# Patient Record
Sex: Male | Born: 1995 | Race: Black or African American | Hispanic: No | Smoking: Current every day smoker
Health system: Southern US, Community
[De-identification: ages and names within clinical notes are randomized; demographics above are authoritative.]

## PROBLEM LIST (undated history)

## (undated) DIAGNOSIS — F909 Attention-deficit hyperactivity disorder, unspecified type: Secondary | ICD-10-CM

## (undated) HISTORY — DX: Attention-deficit hyperactivity disorder, unspecified type: F90.9

## (undated) HISTORY — PX: OTHER SURGICAL HISTORY: SHX169

---
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
## 2016-02-24 ENCOUNTER — Emergency Department (HOSPITAL_COMMUNITY)
Admission: EM | Admit: 2016-02-24 | Discharge: 2016-02-24 | Disposition: A | Discharge status: Home / Self Care | Payer: Medicaid Other | Attending: Emergency Medicine | Admitting: Emergency Medicine

## 2016-02-24 ENCOUNTER — Emergency Department (HOSPITAL_COMMUNITY): Payer: Medicaid Other

## 2016-02-24 ENCOUNTER — Encounter (HOSPITAL_COMMUNITY): Payer: Self-pay | Admitting: Emergency Medicine

## 2016-02-24 DIAGNOSIS — L0889 Other specified local infections of the skin and subcutaneous tissue: Secondary | ICD-10-CM | POA: Diagnosis present

## 2016-02-24 DIAGNOSIS — L03113 Cellulitis of right upper limb: Secondary | ICD-10-CM | POA: Diagnosis not present

## 2016-02-24 DIAGNOSIS — F17200 Nicotine dependence, unspecified, uncomplicated: Secondary | ICD-10-CM | POA: Diagnosis not present

## 2016-02-24 MED ORDER — AMOXICILLIN-POT CLAVULANATE 875-125 MG PO TABS: 1.0000 | ORAL_TABLET | Freq: Two times a day (BID) | ORAL | 0 refills | Status: AC

## 2016-02-24 MED ORDER — SULFAMETHOXAZOLE-TRIMETHOPRIM 800-160 MG PO TABS
1.0000 | ORAL_TABLET | Freq: Once | ORAL | Status: AC
  Administered 2016-02-24: 1 via ORAL
  Filled 2016-02-24: qty 1

## 2016-02-24 MED ORDER — SULFAMETHOXAZOLE-TRIMETHOPRIM 800-160 MG PO TABS: 1.0000 | ORAL_TABLET | Freq: Two times a day (BID) | ORAL | 0 refills | Status: AC

## 2016-02-24 MED ORDER — AMOXICILLIN-POT CLAVULANATE 875-125 MG PO TABS
1.0000 | ORAL_TABLET | Freq: Once | ORAL | Status: AC
  Administered 2016-02-24: 1 via ORAL
  Filled 2016-02-24: qty 1

## 2016-02-24 MED ORDER — OXYCODONE-ACETAMINOPHEN 5-325 MG PO TABS
1.0000 | ORAL_TABLET | Freq: Once | ORAL | Status: AC
  Administered 2016-02-24: 1 via ORAL
  Filled 2016-02-24: qty 1

## 2016-02-24 NOTE — ED Triage Notes (Signed)
Pt c/o wound on R arm. Pt sts a "lump" has been there for a week or so now and that it recently "popped open" and is draining blood and white fluid.  Pt has hx of abscess under R armpit. Redness and swelling noted to area. Pt A&Ox4 and ambulatory.

## 2016-02-24 NOTE — ED Notes (Signed)
PT DISCHARGED. INSTRUCTIONS AND PRESCRIPTIONS GIVEN. AAOX4. PT IN NO APPARENT DISTRESS. THE OPPORTUNITY TO ASK QUESTIONS WAS PROVIDED. 

## 2016-02-24 NOTE — ED Provider Notes (Signed)
[facility]-EMERGENCY DEPT Provider Note   CSN: [id number] Arrival date & time: 02/24/16  1540  By signing my name below, I, Placido SouLogan Joldersma, attest that this documentation has been prepared under the direction and in the presence of Felicie Mornavid Clive Parcel, NP. Electronically Signed: Placido SouLogan Joldersma, ED Scribe. 02/24/16. 6:49 PM.   History   Chief Complaint Chief Complaint  Patient presents with  . Wound Infection    HPI HPI Comments: Kevin Ford is a 20 y.o. male who presents to the Emergency Department complaining of a mild wound to his right elbow x 1 week. Pt states he noticed a point of swelling, redness and warmth to his right elbow which came to a head. He then popped the wound with "a toothpick" with resulting purulent drainage. He reports associated pain surrounding the wound and difficulty sleeping due to the pain. His pain worsens with palpation and movement of his right elbow. No other associated symptoms at this time.   The history is provided by the patient. No language interpreter was used.    History reviewed. No pertinent past medical history.  There are no active problems to display for this patient.   History reviewed. No pertinent surgical history.   Home Medications    Prior to Admission medications   Not on File    Family History No family history on file.  Social History Social History  Substance Use Topics  . Smoking status: Current Every Day Smoker  . Smokeless tobacco: Never Used  . Alcohol use No     Allergies   Patient has no known allergies.   Review of Systems Review of Systems  Constitutional: Negative for chills and fever.  Musculoskeletal: Positive for arthralgias and joint swelling.  Skin: Positive for color change and wound.  Neurological: Negative for numbness.  Psychiatric/Behavioral: Positive for sleep disturbance.  All other systems reviewed and are negative.  Physical Exam Updated Vital Signs BP 126/72 (BP Location: Right Arm)    Pulse 86   Temp 98.6 F (37 C) (Oral)   Resp 22   Ht 6\' 3"  (1.905 m)   Wt 206 lb (93.4 kg)   SpO2 98%   BMI 25.75 kg/m   Physical Exam  Constitutional: He is oriented to person, place, and time. He appears well-developed and well-nourished.  HENT:  Head: Normocephalic and atraumatic.  Eyes: EOM are normal.  Neck: Normal range of motion.  Cardiovascular: Normal rate.   Pulmonary/Chest: Effort normal. No respiratory distress.  Abdominal: Soft.  Musculoskeletal: Normal range of motion.  Neurological: He is alert and oriented to person, place, and time.  Skin: Skin is warm and dry.  See image below  Psychiatric: He has a normal mood and affect.  Nursing note and vitals reviewed.    ED Treatments / Results  Labs (all labs ordered are listed, but only abnormal results are displayed) Labs Reviewed - No data to display  EKG  EKG Interpretation None       Radiology Dg Elbow 2 Views Right  Result Date: 02/24/2016 CLINICAL DATA:  Spontaneously ruptured posterior right elbow mass with draining blood and white fluid. EXAM: RIGHT ELBOW - 2 VIEW COMPARISON:  None. FINDINGS: AP and lateral views demonstrate posterior and ulnar subcutaneous edema. Normal appearing bones. No soft tissue gas, bone destruction or periosteal reaction. No effusion seen. IMPRESSION: Soft tissue swelling without underlying bony abnormality. Electronically Signed   By: Beckie SaltsSteven  Reid M.D.   On: 02/24/2016 19:31   Procedures Procedures  DIAGNOSTIC STUDIES: Oxygen  Saturation is 98% on RA, normal by my interpretation.    COORDINATION OF CARE: 6:47 PM Discussed next steps with pt. Pt verbalized understanding and is agreeable with the plan.    Medications Ordered in ED Medications - No data to display   Initial Impression / Assessment and Plan / ED Course  I have reviewed the triage vital signs and the nursing notes.  Pertinent labs & imaging results that were available during my care of the patient were  reviewed by me and considered in my medical decision making (see chart for details).  Clinical Course     Patient presentation consistent with cellulitis. Afebrile. No tachycardia, hypotension or other symptoms suggestive of severe infection. Area has been demarcated and pt advised to follow up for wound check in 24-36 hours, sooner for worsening systemic symptoms, new lymphangitis, or significant spread of erythema past line of demarcation. Will discharge with augmentin and bactrim. First dose given in ED. Return precautions discussed. Pt appears safe for discharge.     Final Clinical Impressions(s) / ED Diagnoses   Final diagnoses:  Cellulitis of right upper extremity    New Prescriptions New Prescriptions   AMOXICILLIN-CLAVULANATE (AUGMENTIN) 875-125 MG TABLET    Take 1 tablet by mouth 2 (two) times daily. One po bid x 7 days   SULFAMETHOXAZOLE-TRIMETHOPRIM (BACTRIM DS,SEPTRA DS) 800-160 MG TABLET    Take 1 tablet by mouth 2 (two) times daily.    I personally performed the services described in this documentation, which was scribed in my presence. The recorded information has been reviewed and is accurate.    Felicie Mornavid Elaf Clauson, NP 02/24/16 2037    Loren Raceravid Yelverton, MD 02/24/16 [phone]

## 2016-02-25 NOTE — Progress Notes (Signed)
EDCM spoke to patient at bedside.  Patient reports his pcp is no longer at [facility].  He reports his pcp is Dr. Lerry Linerwight Williams.  Patient is aware that he will need to call the DSS to change his pcp on his insurance card.  No further EDCM needs at this time.

## 2016-11-05 ENCOUNTER — Emergency Department (HOSPITAL_COMMUNITY): Payer: Medicaid Other

## 2016-11-05 ENCOUNTER — Encounter (HOSPITAL_COMMUNITY): Payer: Self-pay

## 2016-11-05 ENCOUNTER — Emergency Department (HOSPITAL_COMMUNITY)
Admission: EM | Admit: 2016-11-05 | Discharge: 2016-11-05 | Disposition: A | Discharge status: Home / Self Care | Payer: Medicaid Other | Attending: Emergency Medicine | Admitting: Emergency Medicine

## 2016-11-05 DIAGNOSIS — F17210 Nicotine dependence, cigarettes, uncomplicated: Secondary | ICD-10-CM | POA: Insufficient documentation

## 2016-11-05 DIAGNOSIS — S62317A Displaced fracture of base of fifth metacarpal bone. left hand, initial encounter for closed fracture: Secondary | ICD-10-CM | POA: Insufficient documentation

## 2016-11-05 DIAGNOSIS — Y929 Unspecified place or not applicable: Secondary | ICD-10-CM | POA: Insufficient documentation

## 2016-11-05 DIAGNOSIS — Y999 Unspecified external cause status: Secondary | ICD-10-CM | POA: Insufficient documentation

## 2016-11-05 DIAGNOSIS — Y939 Activity, unspecified: Secondary | ICD-10-CM | POA: Insufficient documentation

## 2016-11-05 DIAGNOSIS — W2209XA Striking against other stationary object, initial encounter: Secondary | ICD-10-CM | POA: Insufficient documentation

## 2016-11-05 NOTE — Progress Notes (Signed)
Orthopedic Tech Progress Note Patient Details:  Kevin Ford 08/03/1995 [id number]  Ortho Devices Type of Ortho Device: Ace wrap Ortho Device/Splint Location: Well Padded Plaster Ulnar Gutter Splint to Lt arm. Ortho Device/Splint Interventions: Application   Kevin Ford 11/05/2016, 9:18 PM

## 2016-11-05 NOTE — ED Provider Notes (Signed)
[facility]-EMERGENCY DEPT Provider Note   CSN: [id number] Arrival date & time: 11/05/16  1843     History   Chief Complaint Chief Complaint  Patient presents with  . Hand Injury    HPI Kevin Ford is a 21 y.o. male who presents to the emergency department with constant left hand pain that began suddenly one week ago after he punched a metal door at work. He reports he has noted mild, worsening swelling over the head of the hand near his pinky finger. No treatment prior to arrival. He reports that he is concerned that he broke that hand because the pain has not improved over the last week. He denies right hand pain, numbness, tingling, or weakness. He is right-hand dominant.  The history is provided by the patient. No language interpreter was used.    History reviewed. No pertinent past medical history.  There are no active problems to display for this patient.   History reviewed. No pertinent surgical history.     Home Medications    Prior to Admission medications   Medication Sig Start Date End Date Taking? Authorizing Provider  amoxicillin-clavulanate (AUGMENTIN) 875-125 MG tablet Take 1 tablet by mouth 2 (two) times daily. One po bid x 7 days 02/24/16   Felicie Morn, NP    Family History History reviewed. No pertinent family history.  Social History Social History  Substance Use Topics  . Smoking status: Current Every Day Smoker  . Smokeless tobacco: Never Used  . Alcohol use No     Allergies   Patient has no known allergies.   Review of Systems Review of Systems  Musculoskeletal: Positive for arthralgias and myalgias.  Skin: Negative for wound.  Allergic/Immunologic: Negative for immunocompromised state.  Neurological: Negative for weakness and numbness.   Physical Exam Updated Vital Signs BP 120/68 (BP Location: Left Arm)   Pulse 100   Temp 98.2 F (36.8 C) (Oral)   Resp 16   Ht 6\' 3"  (1.905 m)   Wt 77.1 kg (170 lb)   SpO2 100%   BMI 21.25 kg/m     Physical Exam  Constitutional: He appears well-developed.  HENT:  Head: Normocephalic.  Eyes: Conjunctivae are normal.  Neck: Neck supple.  Cardiovascular: Normal rate and regular rhythm.   No murmur heard. Pulmonary/Chest: Effort normal.  Abdominal: Soft. He exhibits no distension.  Musculoskeletal:  Tender to palpation over the fifth metacarpal and along the ulnar/medial aspect of the left hand. Radial pulses are 2+ bilaterally. Full range of motion of all digits of the left hand and the left wrist. Sensation is intact throughout. Good grip strength and strength against resistance.  Neurological: He is alert.  Skin: Skin is warm and dry. Capillary refill takes less than 2 seconds.  Psychiatric: His behavior is normal.  Nursing note and vitals reviewed.    ED Treatments / Results  Labs (all labs ordered are listed, but only abnormal results are displayed) Labs Reviewed - No data to display  EKG  EKG Interpretation None       Radiology Dg Hand Complete Left  Result Date: 11/05/2016 CLINICAL DATA:  Pain in left hand on ulnar side; punched a wall 1 week ago; most pain in 5th MCP joint and 5th metacarpal, and 5th CMC joint ; no previous left hand fx EXAM: LEFT HAND - COMPLETE 3+ VIEW COMPARISON:  None. FINDINGS: There is a fracture at the base of the fifth metacarpal. This is an intra-articular fracture, across the radial base, with  the radial base fracture component displaced in a radial direction by 4.5 mm. No fracture comminution. No dislocation. No other fractures.  Joints are normally spaced and aligned. There is ulnar sided soft tissue swelling. IMPRESSION: 1. Mildly displaced fracture at the base of the fifth metacarpal, which extends to the articular surface with the hamate. 2. No other fractures.  No dislocation. Electronically Signed   By: Amie Portlandavid  Ormond M.D.   On: 11/05/2016 19:40    Procedures Procedures (including critical care time)  Medications Ordered in  ED Medications - No data to display   Initial Impression / Assessment and Plan / ED Course  I have reviewed the triage vital signs and the nursing notes.  Pertinent labs & imaging results that were available during my care of the patient were reviewed by me and considered in my medical decision making (see chart for details).     Patient presenting with left hand pain after punching a metal door 1 week ago. X-ray demonstrating mildly displaced fracture at the base of the fifth metacarpal, which extends to the area to the articular surface with the hamate. The patient was placed in an ulnar gutter splint and provided with an outpatient referral to hand surgery. No acute distress. Discussed the plan with the patient who is agreeable at this time. Vital signs stable. The patient is safe and stable for discharge at this time.  Final Clinical Impressions(s) / ED Diagnoses   Final diagnoses:  Closed displaced fracture of base of fifth metacarpal bone of left hand, initial encounter    New Prescriptions Discharge Medication List as of 11/05/2016  9:24 PM       Kathrynne Kulinski, Coral ElseMia A, PA-C 11/06/16 [id number]    Benjiman CorePickering, Nathan, MD 11/07/16 0030

## 2016-11-05 NOTE — Discharge Instructions (Signed)
Please call Dr. Carlos LeveringGramig's office tomorrow morning to schedule follow-up appointment. Please wear the splint continuously until you are evaluated in his office. If he develop new or worsening symptoms including numbness or weakness and the hand or fingers, please return to the emergency department for reevaluation. Please keep the splint clean and dry.

## 2016-11-05 NOTE — ED Notes (Signed)
Orthro Tech callled

## 2016-11-05 NOTE — ED Triage Notes (Signed)
Pt states that he punched a door with left hand  approx 1 week ago , and has been having increasing pain, limited ROM and some deformity is noted to pinky knuckle. Pt states that has some mild numbness to left pinky finger

## 2019-08-20 ENCOUNTER — Encounter (HOSPITAL_COMMUNITY): Payer: Self-pay | Admitting: Emergency Medicine

## 2019-08-20 ENCOUNTER — Other Ambulatory Visit: Payer: Self-pay

## 2019-08-20 ENCOUNTER — Emergency Department (HOSPITAL_COMMUNITY): Payer: Self-pay

## 2019-08-20 ENCOUNTER — Emergency Department (HOSPITAL_COMMUNITY)
Admission: EM | Admit: 2019-08-20 | Discharge: 2019-08-21 | Disposition: A | Discharge status: Home / Self Care | Payer: Self-pay | Attending: Emergency Medicine | Admitting: Emergency Medicine

## 2019-08-20 DIAGNOSIS — R1310 Dysphagia, unspecified: Secondary | ICD-10-CM | POA: Insufficient documentation

## 2019-08-20 DIAGNOSIS — F17200 Nicotine dependence, unspecified, uncomplicated: Secondary | ICD-10-CM | POA: Insufficient documentation

## 2019-08-20 DIAGNOSIS — K122 Cellulitis and abscess of mouth: Secondary | ICD-10-CM | POA: Insufficient documentation

## 2019-08-20 MED ORDER — PREDNISONE 20 MG PO TABS
60.0000 mg | ORAL_TABLET | Freq: Once | ORAL | Status: AC
  Administered 2019-08-20: 60 mg via ORAL
  Filled 2019-08-20: qty 3

## 2019-08-20 MED ORDER — AMOXICILLIN 500 MG PO CAPS
500.0000 mg | ORAL_CAPSULE | Freq: Once | ORAL | Status: AC
  Administered 2019-08-20: 23:00:00 500 mg via ORAL
  Filled 2019-08-20: qty 1

## 2019-08-20 MED ORDER — LIDOCAINE VISCOUS HCL 2 % MT SOLN
15.0000 mL | Freq: Once | OROMUCOSAL | Status: AC
  Administered 2019-08-20: 15 mL via ORAL
  Filled 2019-08-20: qty 15

## 2019-08-20 MED ORDER — ALUM & MAG HYDROXIDE-SIMETH 200-200-20 MG/5ML PO SUSP
30.0000 mL | Freq: Once | ORAL | Status: AC
  Administered 2019-08-20: 30 mL via ORAL
  Filled 2019-08-20: qty 30

## 2019-08-20 NOTE — ED Triage Notes (Signed)
Patient is complaining of mucous build up in back of his throat that started 3 days ago.

## 2019-08-20 NOTE — ED Provider Notes (Signed)
[facility] DEPT Provider Note   CSN: [id number] Arrival date & time: 08/20/19  2220     History Chief Complaint  Patient presents with  . Mucositis    Kevin Ford is a 24 y.o. male.  Patient complains of a 2-day history of "mucous stuck in the back of my throat".  States he does have Covid vaccine 4 days ago developed this about 2 days ago.  He thinks something is blocking his esophagus though he has no difficulty swallowing and is tolerating p.o. without a problem.  He states he does feel short of breath.  He states there is mucus blocking the back of his throat is not able to cough it up.  He has tried steam and hot tea without relief.  He denies having a sore throat.  He denies any cough, fever, chest pain, abdominal pain, nausea or vomiting.  He has no pain with swallowing.  He is able to eat and drink without a problem.  He denies any injury to his throat.  He is able to swallow pills without a problem.  No vomiting.  The history is provided by the patient.       History reviewed. No pertinent past medical history.  There are no problems to display for this patient.   History reviewed. No pertinent surgical history.     History reviewed. No pertinent family history.  Social History   Tobacco Use  . Smoking status: Current Every Day Smoker  . Smokeless tobacco: Never Used  Substance Use Topics  . Alcohol use: No  . Drug use: Yes    Types: Marijuana    Home Medications Prior to Admission medications   Medication Sig Start Date End Date Taking? Authorizing Provider  amoxicillin-clavulanate (AUGMENTIN) 875-125 MG tablet Take 1 tablet by mouth 2 (two) times daily. One po bid x 7 days 02/24/16   Etta Quill, NP    Allergies    Patient has no known allergies.  Review of Systems   Review of Systems  Constitutional: Negative for activity change, appetite change and fever.  HENT: Positive for congestion, postnasal drip and sore  throat. Negative for facial swelling, mouth sores and trouble swallowing.   Respiratory: Negative for cough and shortness of breath.   Cardiovascular: Negative for chest pain and palpitations.  Gastrointestinal: Negative for abdominal pain, nausea and vomiting.  Genitourinary: Negative for dysuria, hematuria, testicular pain and urgency.  Musculoskeletal: Negative for arthralgias and myalgias.  Skin: Negative for wound.  Neurological: Negative for weakness and headaches.   all other systems are negative except as noted in the HPI and PMH.    Physical Exam Updated Vital Signs BP 115/67 (BP Location: Left Arm)   Pulse 68   Temp 98.3 F (36.8 C) (Oral)   Resp 16   Ht 6\' 3"  (1.905 m)   Wt 87.5 kg   SpO2 99%   BMI 24.12 kg/m   Physical Exam Vitals and nursing note reviewed.  Constitutional:      General: He is not in acute distress.    Appearance: He is well-developed.     Comments: No distress, speaking full sentences  HENT:     Head: Normocephalic and atraumatic.     Right Ear: Tympanic membrane normal.     Left Ear: Tympanic membrane normal.     Nose: No congestion or rhinorrhea.     Mouth/Throat:     Pharynx: No oropharyngeal exudate.     Comments: Swelling secretions,  no malocclusion or trismus.  Floor mouth is soft.  No tongue or lip swelling.  Uvula is midline but elongated and mildly erythematous.  No asymmetry of the palate. Eyes:     Conjunctiva/sclera: Conjunctivae normal.     Pupils: Pupils are equal, round, and reactive to light.  Neck:     Comments: No meningismus. Cardiovascular:     Rate and Rhythm: Normal rate and regular rhythm.     Heart sounds: Normal heart sounds. No murmur.  Pulmonary:     Effort: Pulmonary effort is normal. No respiratory distress.     Breath sounds: Normal breath sounds.  Chest:     Chest wall: No tenderness.  Abdominal:     Palpations: Abdomen is soft.     Tenderness: There is no abdominal tenderness. There is no guarding or  rebound.  Musculoskeletal:        General: No tenderness. Normal range of motion.     Cervical back: Normal range of motion and neck supple. No rigidity or tenderness.  Lymphadenopathy:     Cervical: No cervical adenopathy.  Skin:    General: Skin is warm.     Capillary Refill: Capillary refill takes less than 2 seconds.  Neurological:     General: No focal deficit present.     Mental Status: He is alert and oriented to person, place, and time. Mental status is at baseline.     Cranial Nerves: No cranial nerve deficit.     Motor: No abnormal muscle tone.     Coordination: Coordination normal.     Comments: No ataxia on finger to nose bilaterally. No pronator drift. 5/5 strength throughout. CN 2-12 intact.Equal grip strength. Sensation intact.   Psychiatric:        Behavior: Behavior normal.     ED Results / Procedures / Treatments   Labs (all labs ordered are listed, but only abnormal results are displayed) Labs Reviewed  GROUP A STREP BY PCR    EKG None  Radiology DG Neck Soft Tissue  Result Date: 08/20/2019 CLINICAL DATA:  Painful swallowing EXAM: NECK SOFT TISSUES - 1+ VIEW COMPARISON:  None. FINDINGS: There is no evidence of retropharyngeal soft tissue swelling or epiglottic enlargement. The cervical airway is unremarkable and no radio-opaque foreign body identified. IMPRESSION: Negative. Electronically Signed   By: Deatra Robinson M.D.   On: 08/20/2019 23:41   DG Chest 2 View  Result Date: 08/20/2019 CLINICAL DATA:  Difficulty swallowing.  Excessive pharyngeal mucus EXAM: CHEST - 2 VIEW COMPARISON:  None. FINDINGS: The heart size and mediastinal contours are within normal limits. Both lungs are clear. The visualized skeletal structures are unremarkable. IMPRESSION: No active cardiopulmonary disease. Electronically Signed   By: Deatra Robinson M.D.   On: 08/20/2019 23:31    Procedures Procedures (including critical care time)  Medications Ordered in ED Medications  alum &  mag hydroxide-simeth (MAALOX/MYLANTA) 200-200-20 MG/5ML suspension 30 mL (has no administration in time range)    And  lidocaine (XYLOCAINE) 2 % viscous mouth solution 15 mL (has no administration in time range)  predniSONE (DELTASONE) tablet 60 mg (has no administration in time range)  amoxicillin (AMOXIL) capsule 500 mg (has no administration in time range)    ED Course  I have reviewed the triage vital signs and the nursing notes.  Pertinent labs & imaging results that were available during my care of the patient were reviewed by me and considered in my medical decision making (see chart for details).  MDM Rules/Calculators/A&P                     Patient states he has a blockage in the back of his throat for mucus.  He is able to eat and drink without a problem.  There is no difficulty breathing or difficulty swallowing.  Exam shows elongated uvula which patient viewed in the mirror and states is abnormal.  It does appear mildly erythematous and is likely the sensation he is feeling.  He has no evidence of a food impaction.  There is no evidence of a peritonsillar abscess.  Low suspicion for epiglottitis or peritonsillar abscess or retropharyngeal abscess.  X-rays negative for foreign body or retropharyngeal abscess.  Discussed appears normal.  Patient tolerating p.o. and feels improved after prednisone and GI cocktail.  We will treat supportively for suspected uvulitis.  Follow-up with PCP.  Return to the ED with difficulty breathing, difficulty swallowing, any other concerns Final Clinical Impression(s) / ED Diagnoses Final diagnoses:  Uvulitis  Odynophagia    Rx / DC Orders ED Discharge Orders    None       Alizee Maple, Jeannett Senior, MD 08/21/19 [phone]

## 2019-08-21 ENCOUNTER — Encounter (INDEPENDENT_AMBULATORY_CARE_PROVIDER_SITE_OTHER): Payer: Self-pay

## 2019-08-21 LAB — GROUP A STREP BY PCR: Group A Strep by PCR: NOT DETECTED

## 2019-08-21 MED ORDER — AMOXICILLIN 500 MG PO CAPS: 500.0000 mg | ORAL_CAPSULE | Freq: Three times a day (TID) | ORAL | 0 refills | Status: AC

## 2019-08-21 MED ORDER — PREDNISONE 50 MG PO TABS: ORAL_TABLET | ORAL | 0 refills | Status: AC

## 2019-08-21 NOTE — Discharge Instructions (Signed)
Take the antibiotics and steroids as prescribed.  Follow-up with your primary doctor.  Return to the ED with difficulty breathing, difficulty swallowing or other concerns

## 2019-08-23 ENCOUNTER — Ambulatory Visit: Payer: Self-pay | Admitting: *Deleted

## 2019-08-23 NOTE — Telephone Encounter (Signed)
Pt states took 2 Acia supplements with prednisone just prescribed. Advised to stop Acai until prednisone course completed as may interfere with absorption. Pt verbalizes understanding.   Reason for Disposition . Caller has medication question only, adult not sick, and triager answers question  Answer Assessment - Initial Assessment Questions 1.   NAME of MEDICATION: "What medicine are you calling about?"     Acai supplement with prednisone 2.   QUESTION: "What is your question?"     CAn I take together?  Protocols used: MEDICATION QUESTION CALL-A-AH

## 2019-08-25 ENCOUNTER — Other Ambulatory Visit: Payer: Self-pay

## 2019-08-25 ENCOUNTER — Encounter: Payer: Self-pay | Admitting: Family Medicine

## 2019-08-25 ENCOUNTER — Ambulatory Visit: Payer: Self-pay | Attending: Family Medicine | Admitting: Family Medicine

## 2019-08-25 VITALS — BP 120/77 | HR 81 | Temp 98.8°F | Ht 75.0 in | Wt 180.6 lb

## 2019-08-25 DIAGNOSIS — E010 Iodine-deficiency related diffuse (endemic) goiter: Secondary | ICD-10-CM

## 2019-08-25 DIAGNOSIS — R634 Abnormal weight loss: Secondary | ICD-10-CM

## 2019-08-25 DIAGNOSIS — J00 Acute nasopharyngitis [common cold]: Secondary | ICD-10-CM

## 2019-08-25 DIAGNOSIS — Z09 Encounter for follow-up examination after completed treatment for conditions other than malignant neoplasm: Secondary | ICD-10-CM

## 2019-08-25 DIAGNOSIS — R0789 Other chest pain: Secondary | ICD-10-CM

## 2019-08-25 MED ORDER — FAMOTIDINE 20 MG PO TABS: 20.0000 mg | ORAL_TABLET | Freq: Two times a day (BID) | ORAL | 0 refills | Status: DC

## 2019-08-25 MED ORDER — CETIRIZINE HCL 10 MG PO TABS: 10.0000 mg | ORAL_TABLET | Freq: Every day | ORAL | 2 refills | Status: AC

## 2019-08-25 NOTE — Patient Instructions (Signed)

## 2019-08-25 NOTE — Progress Notes (Signed)
HFU  Possible anxiety (GAD-7 is high today)  Problems eating   Chest pains only at night

## 2019-08-25 NOTE — Progress Notes (Signed)
Subjective:  Patient ID: Kevin Ford, male    DOB: [birth year]  Age: 24 y.o. MRN: [id number]  CC: ED follow-up  HPI Kevin Ford, 24 yo male, new to the practice, who is status post ED visit on 08/20/2019 with complaint of feeling as if he had a lot of mucous in his throat and he was diagnosed with Uvulitis.  He reports that he is not believe that the prescribed antibiotics and prednisone helped with the abnormal throat sensation that he was experiencing.  Patient feels as if he is having postnasal drainage.  He also feels as if after starting the prednisone, he developed some left-sided chest pressure/discomfort as if there were bubbles in his chest at times.  He denies any fever or chills.  He has had some occasional pressure in the forehead area.  He has had some nasal congestion and runny nose.  Nasal drainage has been clear.  He also tends to have a cough at night after lying down.  Cough is nonproductive.  He denies sore throat but has some throat irritation from the postnasal drainage.  He denies any difficulty swallowing.  He has a sensation that there is mucus in the back of his throat.  He has noticed some enlargement in the mid to lower part of his neck and wonders if he may have an issue with his thyroid.  He has felt that he has been losing weight without trying in addition to sometimes feeling warmer than other people and occasional sensation of increased heart rate.  Past Medical History:  Diagnosis Date  . ADHD (attention deficit hyperactivity disorder)     Past Surgical History:  Procedure Laterality Date  . none      Family History  Problem Relation Age of Onset  . HIV Mother   . Diabetes Father   . Hypertension Father     Social History   Tobacco Use  . Smoking status: Current Every Day Smoker  . Smokeless tobacco: Never Used  Substance Use Topics  . Alcohol use: No    ROS Review of Systems  Constitutional: Positive for fatigue (mild). Negative for chills  and fever.  HENT: Positive for postnasal drip, rhinorrhea and sinus pressure (forehead). Negative for ear pain (some ear pressure), nosebleeds, sore throat (throat irritation from drainage) and trouble swallowing.   Respiratory: Positive for cough (at night when lying down). Negative for shortness of breath.   Cardiovascular: Negative for chest pain, palpitations and leg swelling.  Gastrointestinal: Negative for abdominal pain, constipation, diarrhea and nausea.  Endocrine: Negative for cold intolerance, heat intolerance, polydipsia, polyphagia and polyuria.  Genitourinary: Negative for dysuria and frequency.  Musculoskeletal: Negative for arthralgias and back pain.  Skin: Negative for rash and wound.  Neurological: Negative for dizziness and headaches.  Hematological: Negative for adenopathy. Does not bruise/bleed easily.    Objective:   Today's Vitals: BP 120/77   Pulse 81   Temp 98.8 F (37.1 C) (Temporal)   Ht 6\' 3"  (1.905 m)   Wt 180 lb 9.6 oz (81.9 kg)   SpO2 96%   BMI 22.57 kg/m   Physical Exam Vitals reviewed.  Constitutional:      Appearance: Normal appearance.     Comments: WNWD young adult male in NAD wearing a mask as per office COVID protocol. Patient with a slight nasal quality to his voice.  HENT:     Right Ear: Hearing, ear canal and external ear normal. Tympanic membrane is erythematous.  Left Ear: Hearing, ear canal and external ear normal. Tympanic membrane is erythematous.     Ears:     Comments: TMs are dark pink bilaterally, thickened and without visible landmarks    Nose: Mucosal edema, congestion and rhinorrhea present.     Right Turbinates: Not enlarged or swollen.     Left Turbinates: Not enlarged or swollen.     Right Sinus: No maxillary sinus tenderness or frontal sinus tenderness.     Left Sinus: No maxillary sinus tenderness or frontal sinus tenderness.     Mouth/Throat:     Pharynx: Posterior oropharyngeal erythema present. No oropharyngeal  exudate or uvula swelling.     Comments: Mild to moderate edema/erythema and cobblestoning of the posterior pharynx.  Uvula is midline and does not appear swollen but is slightly erythematous.  Patient does have long uvula. Neck:     Comments: Generalized enlargement of the thyroid.  Mild anterior cervical chain lymphadenopathy Cardiovascular:     Rate and Rhythm: Normal rate and regular rhythm.  Pulmonary:     Effort: Pulmonary effort is normal.     Breath sounds: Normal breath sounds. No rhonchi.  Abdominal:     Palpations: Abdomen is soft.     Tenderness: There is abdominal tenderness (With palpation in the epigastric area, patient has sensation of air bubbles in chest similar to recent recurrent chest pain). There is no right CVA tenderness, left CVA tenderness, guarding or rebound.  Musculoskeletal:        General: No tenderness.     Cervical back: Normal range of motion and neck supple. No rigidity or tenderness.     Right lower leg: No edema.     Left lower leg: No edema.  Lymphadenopathy:     Cervical: Cervical adenopathy present.  Skin:    General: Skin is warm and dry.     Findings: No rash.  Neurological:     General: No focal deficit present.     Mental Status: He is alert and oriented to person, place, and time.  Psychiatric:        Mood and Affect: Mood normal.     Assessment & Plan:  1. Thyromegaly; 2.  Abnormal weight loss Patient presents with thyromegaly on examination and he reports that he feels as if he has had some recent abnormal weight loss without trying as well as occasional sensation of increased heart rate and feeling warmer than others on occasion.  Will check thyroid panel with TSH to look for hyperthyroidism and patient will be scheduled for thyroid ultrasound and follow-up with thyromegaly.  We will also check CBC and basic metabolic panel in follow-up of patient's complaint of possible abnormal weight loss - Thyroid Panel With TSH - US THYROID;  Future - CBC with Differential - Basic Metabolic Panel  3. Rhinopharyngitis; encounter for examination following treatment at hospital Notes and labs from patient's recent emergency department visit on 08/20/2019 were reviewed and discussed with the patient at today's visit.  Patient appears to have rhinopharyngitis as a cause of his abnormal throat sensation as he does have evidence of postnasal drainage on examination.  He also appears to have otitis media but has recently been on Augmentin after his ED visit and denies current ear pain.  He does have some ear pressure in addition to his postnasal drainage.  He will be placed on Zyrtec 10 mg at bedtime to help with postnasal drainage and he is aware that the medication can cause drowsiness. - cetirizine (  ZYRTEC) 10 MG tablet; Take 1 tablet (10 mg total) by mouth daily. At bedtime as needed for congestion  Dispense: 30 tablet; Refill: 2  4. Atypical chest pain Patient with onset of atypical chest pain after use of prednisone.  Prednisone use can cause stomach upset/gastritis and patient with complaint of reproducibility of atypical chest pain sensation with palpation of the epigastric area at today's visit.  He will be placed on Pepcid 20 mg twice daily and should avoid use of nonsteroidal anti-inflammatories and spicy/greasy foods.  He should return in the next 1 to 2 weeks if he has continued issues with atypical chest pain.  He was made aware that he should go to emergency department if he has any concerns regarding chest pain, throat discomfort or any other concerns. - famotidine (PEPCID) 20 MG tablet; Take 1 tablet (20 mg total) by mouth 2 (two) times daily. To reduce stomach acid  Dispense: 60 tablet; Refill: 0.   Outpatient Encounter Medications as of 08/25/2019  Medication Sig  . naproxen (NAPROSYN) 375 MG tablet Take 375 mg by mouth daily as needed for moderate pain.  Marland Kitchen amoxicillin (AMOXIL) 500 MG capsule Take 1 capsule (500 mg total) by mouth 3  (three) times daily. (Patient not taking: Reported on 08/25/2019)  . amoxicillin-clavulanate (AUGMENTIN) 875-125 MG tablet Take 1 tablet by mouth 2 (two) times daily. One po bid x 7 days (Patient not taking: Reported on 08/20/2019)  . cetirizine (ZYRTEC) 10 MG tablet Take 1 tablet (10 mg total) by mouth daily. At bedtime as needed for congestion  . famotidine (PEPCID) 20 MG tablet Take 1 tablet (20 mg total) by mouth 2 (two) times daily. To reduce stomach acid  . predniSONE (DELTASONE) 50 MG tablet 1 tablet PO daily (Patient not taking: Reported on 08/25/2019)   No facility-administered encounter medications on file as of 08/25/2019.    An After Visit Summary was printed and given to the patient.   Follow-up: Return for one or two weeks if not feeling better and ED if worse.    Antony Blackbird MD

## 2019-08-26 LAB — BASIC METABOLIC PANEL WITH GFR
BUN/Creatinine Ratio: 17 (ref 9–20)
BUN: 15 mg/dL (ref 6–20)
CO2: 25 mmol/L (ref 20–29)
Calcium: 9.9 mg/dL (ref 8.7–10.2)
Chloride: 101 mmol/L (ref 96–106)
Creatinine, Ser: 0.88 mg/dL (ref 0.76–1.27)
GFR calc Af Amer: 139 mL/min/1.73
GFR calc non Af Amer: 120 mL/min/1.73
Glucose: 86 mg/dL (ref 65–99)
Potassium: 4.1 mmol/L (ref 3.5–5.2)
Sodium: 142 mmol/L (ref 134–144)

## 2019-08-26 LAB — CBC WITH DIFFERENTIAL/PLATELET
Basophils Absolute: 0.1 x10E3/uL (ref 0.0–0.2)
Basos: 1 %
EOS (ABSOLUTE): 0 x10E3/uL (ref 0.0–0.4)
Eos: 1 %
Hematocrit: 45.1 % (ref 37.5–51.0)
Hemoglobin: 15.5 g/dL (ref 13.0–17.7)
Immature Grans (Abs): 0 x10E3/uL (ref 0.0–0.1)
Immature Granulocytes: 0 %
Lymphocytes Absolute: 3.7 x10E3/uL — ABNORMAL HIGH (ref 0.7–3.1)
Lymphs: 42 %
MCH: 31.7 pg (ref 26.6–33.0)
MCHC: 34.4 g/dL (ref 31.5–35.7)
MCV: 92 fL (ref 79–97)
Monocytes Absolute: 0.7 x10E3/uL (ref 0.1–0.9)
Monocytes: 8 %
Neutrophils Absolute: 4.2 x10E3/uL (ref 1.4–7.0)
Neutrophils: 48 %
Platelets: 275 x10E3/uL (ref 150–450)
RBC: 4.89 x10E6/uL (ref 4.14–5.80)
RDW: 12.3 % (ref 11.6–15.4)
WBC: 8.7 x10E3/uL (ref 3.4–10.8)

## 2019-08-26 LAB — THYROID PANEL WITH TSH
Free Thyroxine Index: 2.6 (ref 1.2–4.9)
T3 Uptake Ratio: 29 % (ref 24–39)
T4, Total: 9.1 ug/dL (ref 4.5–12.0)
TSH: 1.6 u[IU]/mL (ref 0.450–4.500)

## 2019-08-28 ENCOUNTER — Ambulatory Visit: Payer: Self-pay | Admitting: Pharmacist

## 2019-08-30 ENCOUNTER — Ambulatory Visit (HOSPITAL_COMMUNITY): Admission: RE | Admit: 2019-08-30 | Payer: Self-pay | Source: Ambulatory Visit

## 2019-08-30 ENCOUNTER — Encounter (HOSPITAL_COMMUNITY): Payer: Self-pay

## 2019-09-17 ENCOUNTER — Other Ambulatory Visit: Payer: Self-pay | Admitting: Family Medicine

## 2019-09-17 DIAGNOSIS — R0789 Other chest pain: Secondary | ICD-10-CM

## 2020-02-02 ENCOUNTER — Other Ambulatory Visit: Payer: Self-pay | Admitting: Family Medicine

## 2020-02-02 DIAGNOSIS — R0789 Other chest pain: Secondary | ICD-10-CM

## 2021-01-29 ENCOUNTER — Ambulatory Visit: Payer: Self-pay | Admitting: Family Medicine

## 2022-03-14 IMAGING — CR DG NECK SOFT TISSUE
2 series · 2 of 2 positions shown · non-contrast
Comparison: None.

CLINICAL DATA: Painful swallowing

EXAM:
NECK SOFT TISSUES - 1+ VIEW

[w soft tissue neck lat (1 of 2)]
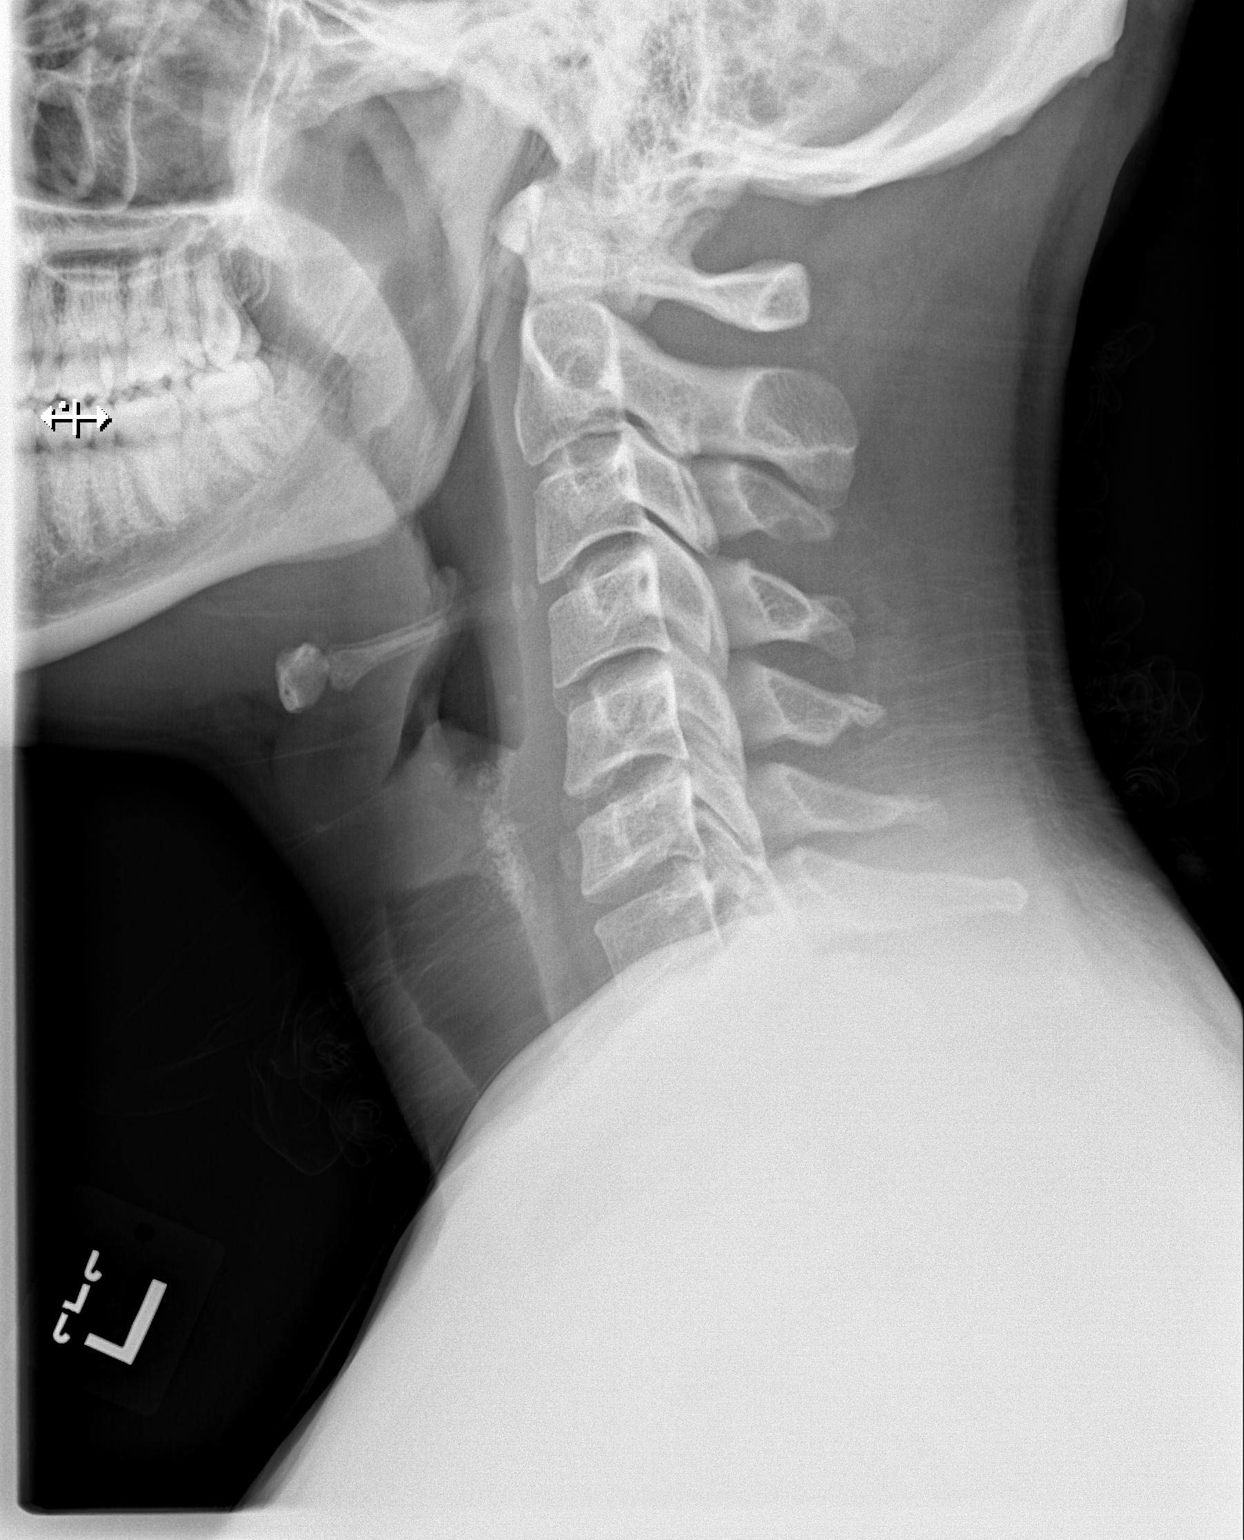

[w soft tissue neck lat (2 of 2)]
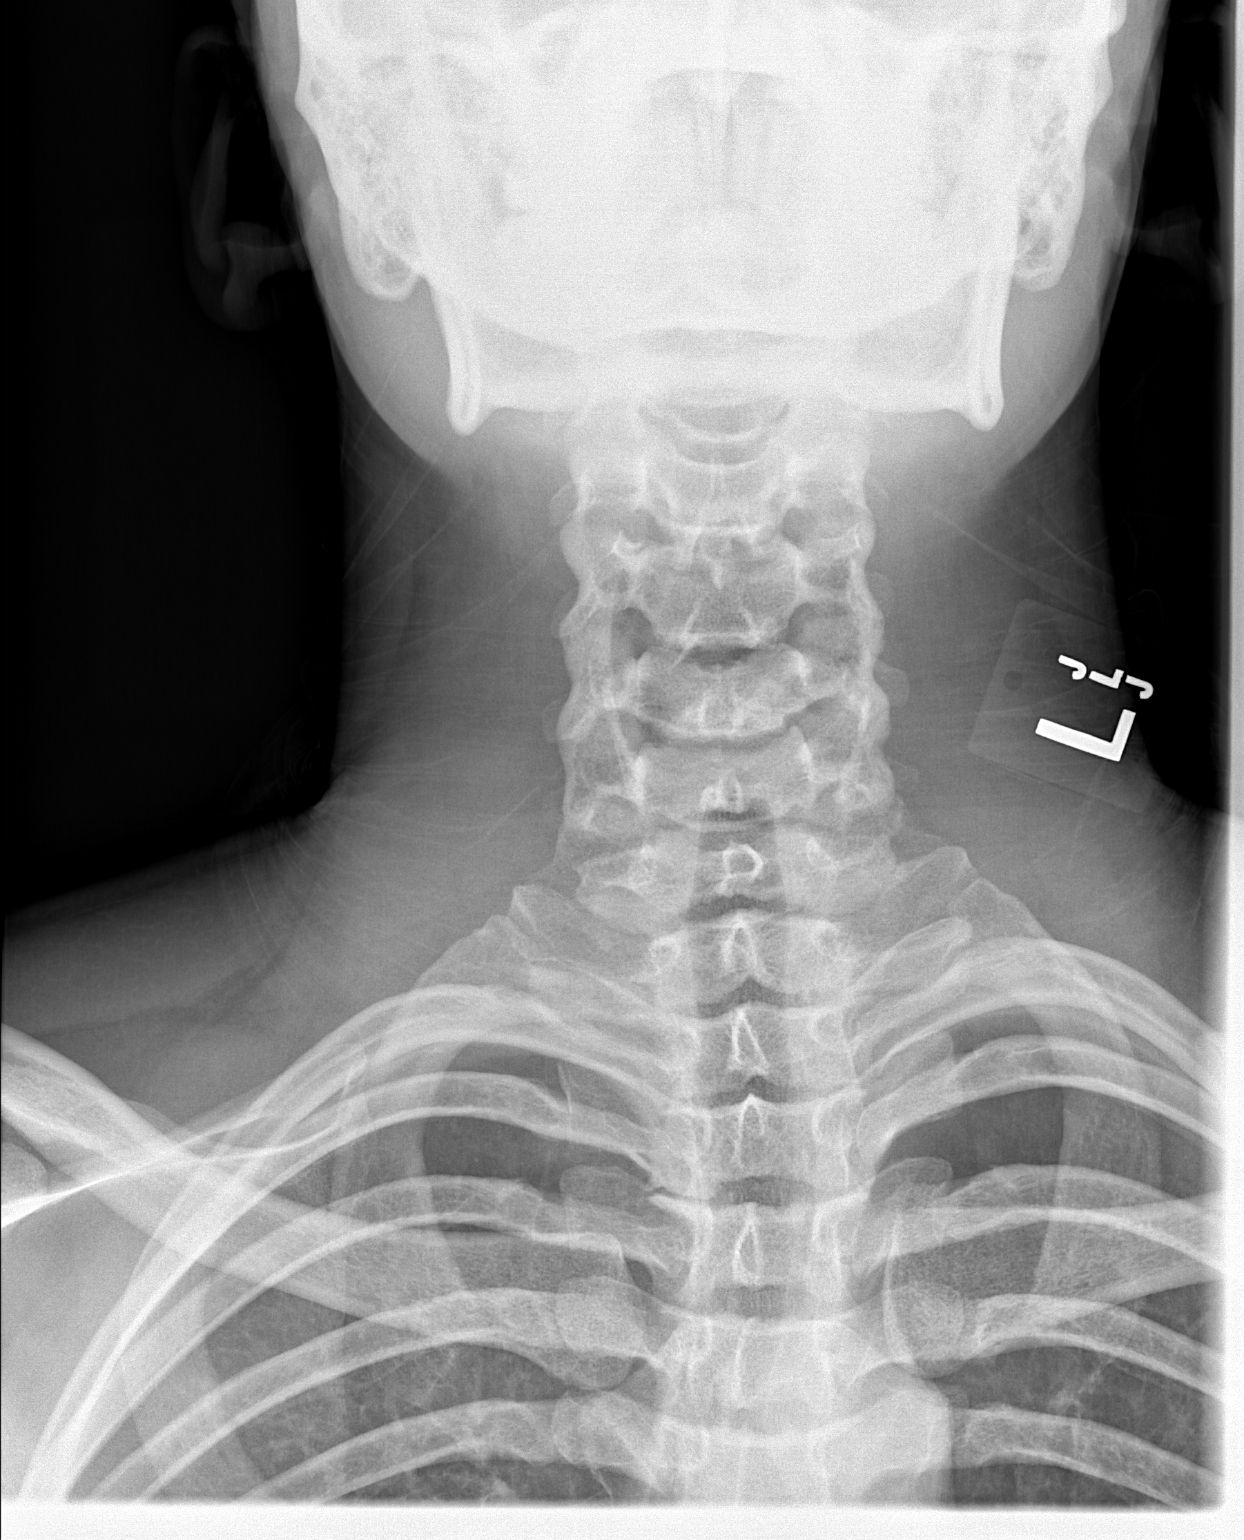

[2 of 2 positions shown; findings below may reference images not displayed]

FINDINGS: There is no evidence of retropharyngeal soft tissue swelling or
epiglottic enlargement. The cervical airway is unremarkable and no
radio-opaque foreign body identified.
IMPRESSION: Negative.

## 2022-03-14 IMAGING — CR DG CHEST 2V
2 series · 2 of 2 positions shown · non-contrast
Comparison: None.

CLINICAL DATA: Difficulty swallowing.  Excessive pharyngeal mucus

EXAM:
CHEST - 2 VIEW

[w chest pa]
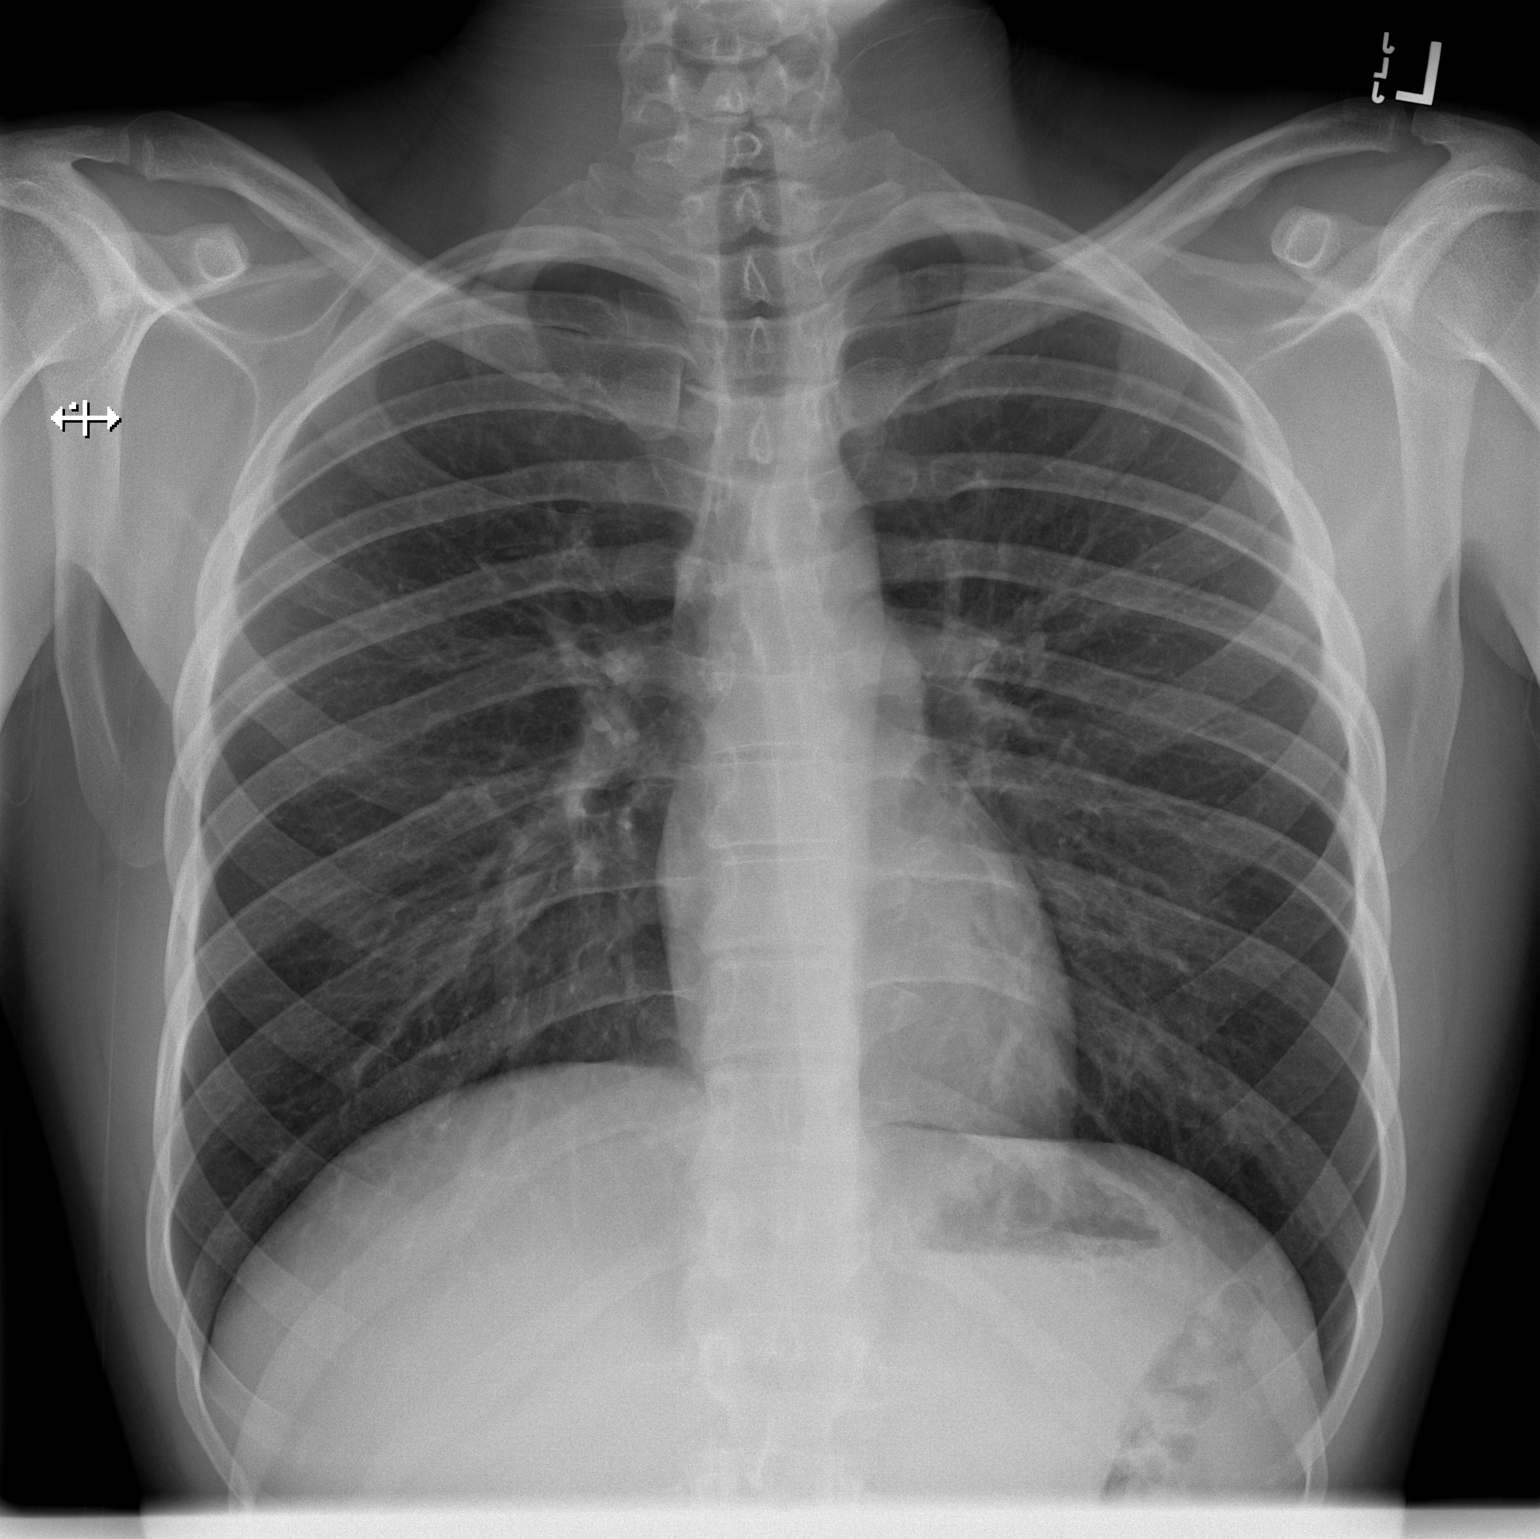

[w chest lat]
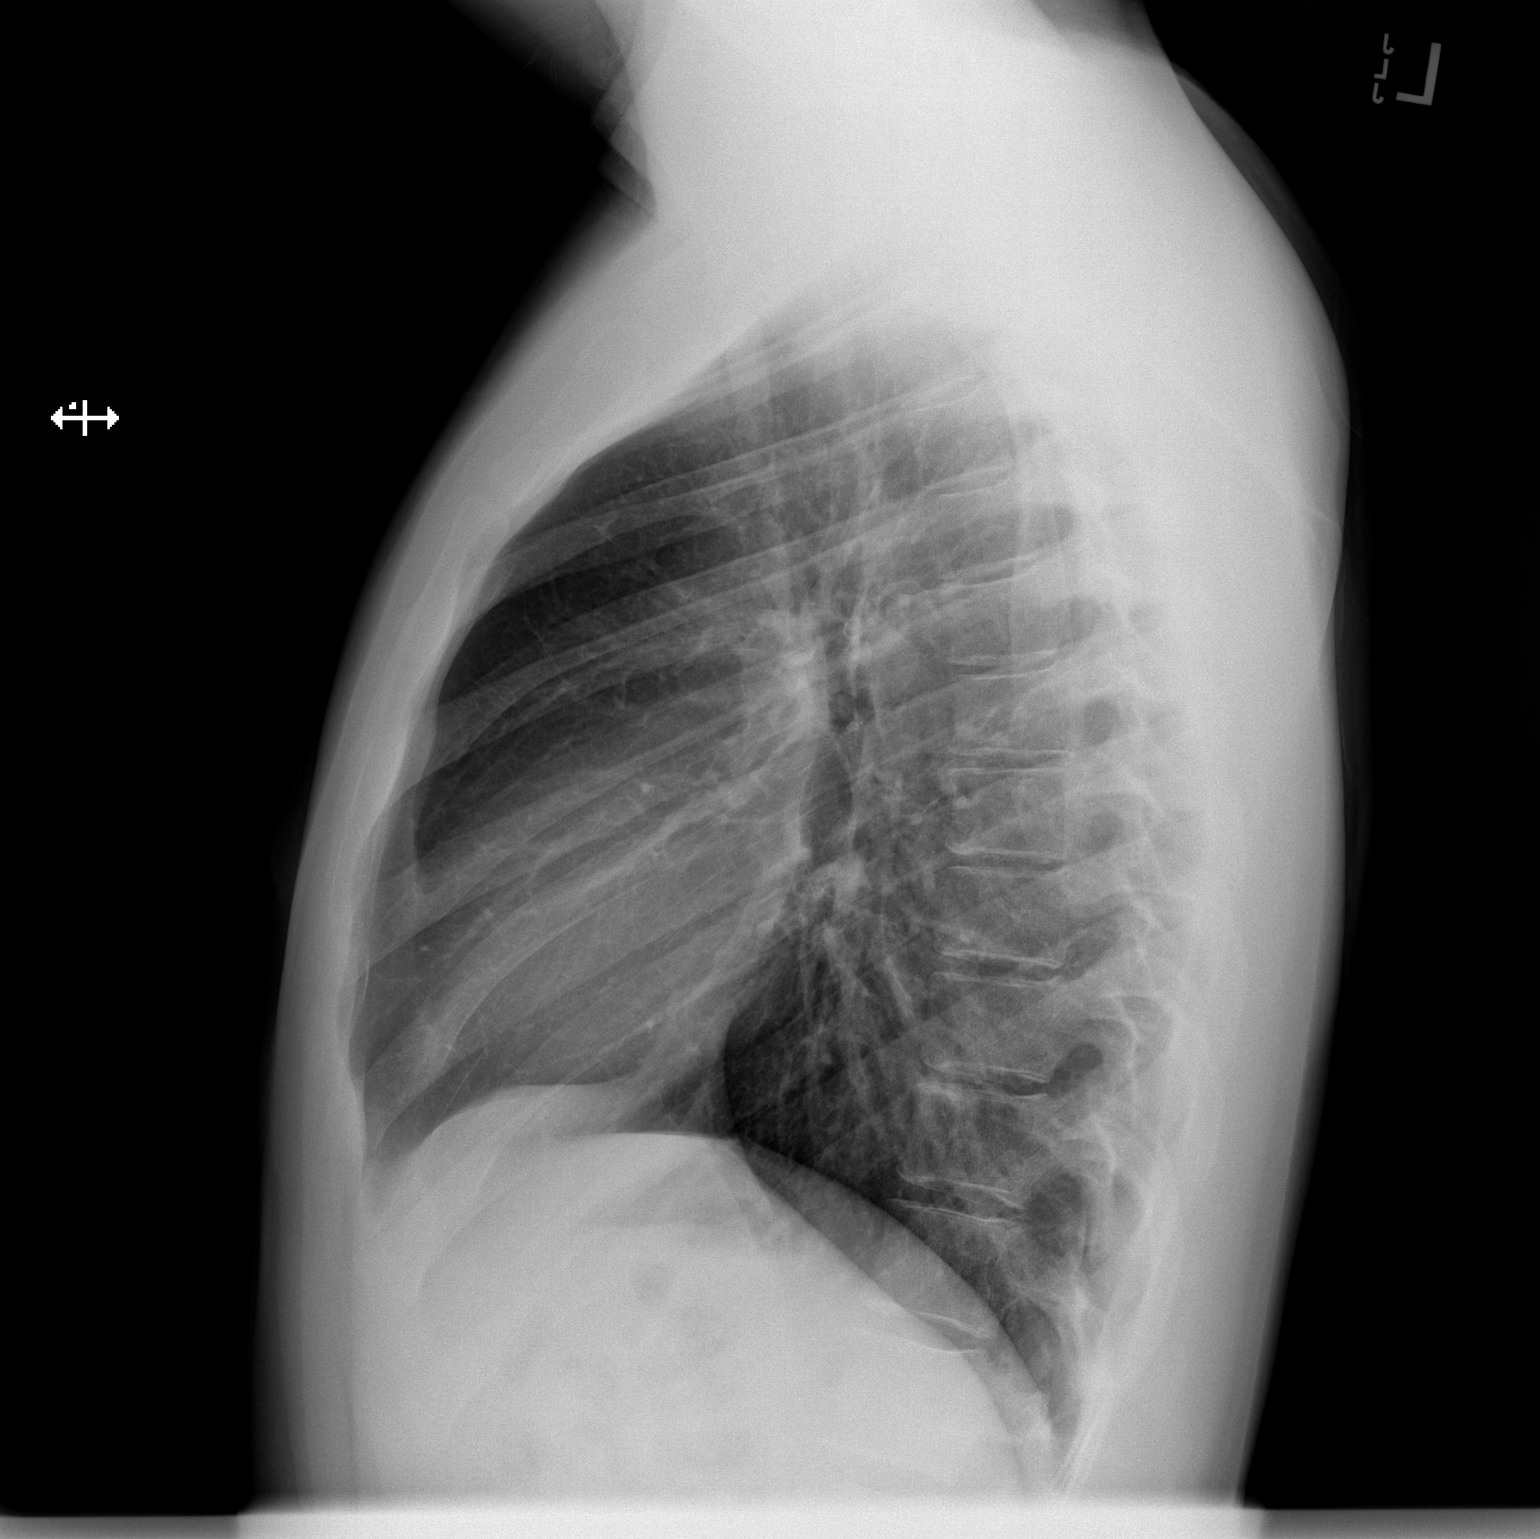

[2 of 2 positions shown; findings below may reference images not displayed]

FINDINGS: The heart size and mediastinal contours are within normal limits.
Both lungs are clear. The visualized skeletal structures are
unremarkable.
IMPRESSION: No active cardiopulmonary disease.

## 2023-05-13 ENCOUNTER — Ambulatory Visit
Admission: RE | Admit: 2023-05-13 | Discharge: 2023-05-13 | Disposition: A | Discharge status: Home / Self Care | Payer: Self-pay | Source: Ambulatory Visit | Attending: Emergency Medicine | Admitting: Emergency Medicine

## 2023-05-13 VITALS — BP 125/86 | HR 75 | Temp 98.2°F | Resp 18

## 2023-05-13 DIAGNOSIS — H538 Other visual disturbances: Secondary | ICD-10-CM

## 2023-05-13 DIAGNOSIS — R079 Chest pain, unspecified: Secondary | ICD-10-CM

## 2023-05-13 NOTE — Discharge Instructions (Addendum)
Please see an eye specialist as soon as able  I have placed a referral to cardiology. They will call you to schedule an appointment  If at any point you have severe chest pain, shortness of breath, palpitations, or dizziness, please go to the emergency department

## 2023-05-13 NOTE — ED Provider Notes (Signed)
[facility]    CSN: [id number] Arrival date & time: 05/13/23  6962     History   Chief Complaint Chief Complaint  Patient presents with   Hypertension    Blurry vision at random times Heart feels like it's going to beat out of my chest not all the time but mostly at night When I look around everything looks off it's hard to explain And chest pain time to time - Entered by patient   Chest Pain    HPI Kevin Ford is a 28 y.o. male.  3 month history of intermittent chest pain Occasional palpitations that only last a few seconds Never gets shortness of breath. He is not currently having symptoms. He presented today thinking it was a walk-in cardiology clinic.   Also reports blurry vision for several years Has trouble seeing distance  1 year ago was seen in ED, afib noted that spontaneously converted. Was advised to follow with cardiology but never did  He has stopped using energy drinks Reports increased stress recently  Laid off from work 2-3 weeks ago  Father died age 13s unknown reason. Hx HTN and DM  Past Medical History:  Diagnosis Date   ADHD (attention deficit hyperactivity disorder)    pt states he has never been dx with this    There are no active problems to display for this patient.   Past Surgical History:  Procedure Laterality Date   none         Home Medications    Prior to Admission medications   Medication Sig Start Date End Date Taking? Authorizing Provider  amoxicillin (AMOXIL) 500 MG capsule Take 1 capsule (500 mg total) by mouth 3 (three) times daily. Patient not taking: Reported on 08/25/2019 08/21/19   Glynn Octave, MD  amoxicillin-clavulanate (AUGMENTIN) 875-125 MG tablet Take 1 tablet by mouth 2 (two) times daily. One po bid x 7 days Patient not taking: Reported on 08/20/2019 02/24/16   Felicie Morn, NP  cetirizine (ZYRTEC) 10 MG tablet Take 1 tablet (10 mg total) by mouth daily. At bedtime as needed for congestion 08/25/19    Fulp, Cammie, MD  famotidine (PEPCID) 20 MG tablet TAKE 1 TABLET (20 MG TOTAL) BY MOUTH 2 (TWO) TIMES DAILY. TO REDUCE STOMACH ACID 02/02/20   Fulp, Cammie, MD  naproxen (NAPROSYN) 375 MG tablet Take 375 mg by mouth daily as needed for moderate pain.    [provider]  predniSONE (DELTASONE) 50 MG tablet 1 tablet PO daily Patient not taking: Reported on 08/25/2019 08/21/19   Glynn Octave, MD    Family History Family History  Problem Relation Age of Onset   HIV Mother    Diabetes Father    Hypertension Father     Social History Social History   Tobacco Use   Smoking status: Every Day    Current packs/day: 1.00    Average packs/day: 1 pack/day for 15.1 years (15.1 ttl pk-yrs)    Types: Cigarettes    Start date: 2010   Smokeless tobacco: Never  Vaping Use   Vaping status: Former  Substance Use Topics   Alcohol use: No   Drug use: Yes    Frequency: 2.0 times per week    Types: Marijuana     Allergies   Patient has no known allergies.   Review of Systems Review of Systems Per HPI  Physical Exam Triage Vital Signs ED Triage Vitals  Encounter Vitals Group     BP 05/13/23 0939 125/86  Systolic BP Percentile --      Diastolic BP Percentile --      Pulse Rate 05/13/23 0939 75     Resp 05/13/23 0939 18     Temp 05/13/23 0939 98.2 F (36.8 C)     Temp Source 05/13/23 0939 Oral     SpO2 05/13/23 0939 97 %     Weight --      Height --      Head Circumference --      Peak Flow --      Pain Score 05/13/23 0931 0     Pain Loc --      Pain Education --      Exclude from Growth Chart --    No data found.  Updated Vital Signs BP 125/86 (BP Location: Left Arm)   Pulse 75   Temp 98.2 F (36.8 C) (Oral)   Resp 18   SpO2 97%    Physical Exam Vitals and nursing note reviewed.  Constitutional:      General: He is not in acute distress.    Appearance: Normal appearance. He is not ill-appearing or diaphoretic.  HENT:     Mouth/Throat:     Pharynx:  Oropharynx is clear.  Eyes:     Extraocular Movements: Extraocular movements intact.     Conjunctiva/sclera: Conjunctivae normal.     Pupils: Pupils are equal, round, and reactive to light.  Cardiovascular:     Rate and Rhythm: Normal rate and regular rhythm. No extrasystoles are present.    Pulses: Normal pulses.          Radial pulses are 2+ on the right side and 2+ on the left side.     Heart sounds: Normal heart sounds, S1 normal and S2 normal.  Pulmonary:     Effort: Pulmonary effort is normal.     Breath sounds: Normal breath sounds.  Musculoskeletal:        General: Normal range of motion.     Cervical back: Normal range of motion.  Neurological:     Mental Status: He is alert and oriented to person, place, and time.     UC Treatments / Results  Labs (all labs ordered are listed, but only abnormal results are displayed) Labs Reviewed - No data to display  EKG   Radiology No results found.  Procedures Procedures (including critical care time)  Medications Ordered in UC Medications - No data to display  Initial Impression / Assessment and Plan / UC Course  I have reviewed the triage vital signs and the nursing notes.  Pertinent labs & imaging results that were available during my care of the patient were reviewed by me and considered in my medical decision making (see chart for details).  Stable vitals.  EKG normal sinus, ventricular rate of 64 bpm No ST or T wave abnormality noted No comparison is available  With 3 months of intermittent symptoms, needs follow up with primary and cardiology. He is asymptomatic at this time and no red flags. Referral to cardiology placed. Advised seeing eye specialist for blurry vision. ED precautions discussed.  Final Clinical Impressions(s) / UC Diagnoses   Final diagnoses:  Intermittent chest pain  Blurry vision, bilateral     Discharge Instructions      Please see an eye specialist as soon as able  I have  placed a referral to cardiology. They will call you to schedule an appointment  If at any point you have severe chest pain, shortness  of breath, palpitations, or dizziness, please go to the emergency department      ED Prescriptions   None    PDMP not reviewed this encounter.   Roston Grunewald, Lurena Joiner, [place] 05/13/23 1025

## 2023-05-13 NOTE — ED Triage Notes (Signed)
Pt reports intermittent sharp L sided CP x 3 months. States he now also feels disassociated, bilateral arm tingling, and blurry vision during these episodes.   Says by disassociated he means feeling like he is going to pass out and is in third person viewing himself.  The blurry vision can be L or R or both - changes.    December [facility] note indicates possible afib with RVR.  No known h/o afib prior to that.    No longer drinks energy drinks, has been under stress lately.

## 2023-07-06 ENCOUNTER — Ambulatory Visit: Payer: Self-pay | Attending: Internal Medicine | Admitting: Internal Medicine

## 2023-07-06 NOTE — Progress Notes (Deleted)
  Cardiology Office Note:  .   Date:  07/06/2023  ID:  Theresia Lo, DOB 02/06/1996, MRN [id number] PCP: Cain Saupe, MD  [facility] Providers Cardiologist:  None { Click to update primary MD,subspecialty MD or APP then REFRESH:1}   History of Present Illness: .   CAMRY THEISS is a 28 y.o. male who presented to the emergency room in January for 3 months of intermittent chest pain as well as occasional palpitations that only last a few seconds.  He also report reported blurry vision.  He was recently laid off and under a lot of stress.  His vitals were unremarkable except for an elevated diastolic blood pressure.  His ECG was normal.  Despite his benign presentation he was referred to cardiology because he wanted to see one.    ROS:  per HPI otherwise negative   Studies Reviewed: .        *** Risk Assessment/Calculations:   {Does this patient have ATRIAL FIBRILLATION?[phone]} No BP recorded.  {Refresh Note OR Click here to enter BP  :1}***       Physical Exam:   VS:  There were no vitals taken for this visit.   Wt Readings from Last 3 Encounters:  08/25/19 180 lb 9.6 oz (81.9 kg)  08/20/19 190 lb (86.2 kg)  11/05/16 170 lb (77.1 kg)    GEN: Well nourished, well developed in no acute distress NECK: No JVD; No carotid bruits CARDIAC: ***RRR, no murmurs, rubs, gallops RESPIRATORY:  Clear to auscultation without rales, wheezing or rhonchi  ABDOMEN: Soft, non-tender, non-distended EXTREMITIES:  No edema; No deformity   ASSESSMENT AND PLAN: .   Atypical Chest pain/palpitations He is very low risk for coronary disease.  No signs of structural heart disease.  His palpitations ***  HTN Recommend lifestyle changes including weight loss ***.   Dispo: ***  Signed, Maisie Fus, MD

## 2023-08-19 ENCOUNTER — Ambulatory Visit: Payer: Self-pay

## 2023-08-30 ENCOUNTER — Ambulatory Visit: Payer: Self-pay | Attending: Cardiology | Admitting: Cardiology
